# Patient Record
Sex: Male | Born: 1985 | Race: White | Hispanic: No | Marital: Single | Smoking: Never smoker
Health system: Southern US, Community
[De-identification: ages and names within clinical notes are randomized; demographics above are authoritative.]

---
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
## 2010-12-20 ENCOUNTER — Emergency Department: Payer: Self-pay | Admitting: Emergency Medicine

## 2013-12-24 ENCOUNTER — Ambulatory Visit (INDEPENDENT_AMBULATORY_CARE_PROVIDER_SITE_OTHER): Payer: BC Managed Care – PPO | Admitting: Sports Medicine

## 2013-12-24 ENCOUNTER — Encounter: Payer: Self-pay | Admitting: Sports Medicine

## 2013-12-24 VITALS — BP 128/75 | HR 58 | Ht 69.0 in | Wt 175.0 lb

## 2013-12-24 DIAGNOSIS — M7701 Medial epicondylitis, right elbow: Secondary | ICD-10-CM | POA: Insufficient documentation

## 2013-12-24 DIAGNOSIS — Z299 Encounter for prophylactic measures, unspecified: Secondary | ICD-10-CM

## 2013-12-24 DIAGNOSIS — Z0000 Encounter for general adult medical examination without abnormal findings: Secondary | ICD-10-CM | POA: Insufficient documentation

## 2013-12-24 DIAGNOSIS — M7700 Medial epicondylitis, unspecified elbow: Secondary | ICD-10-CM

## 2013-12-24 MED ORDER — NITROGLYCERIN 0.2 MG/HR TD PT24: MEDICATED_PATCH | TRANSDERMAL | Status: DC

## 2013-12-24 MED ORDER — MELOXICAM 15 MG PO TABS: ORAL_TABLET | ORAL | Status: DC

## 2013-12-24 MED ORDER — HYDROCODONE-ACETAMINOPHEN 5-325 MG PO TABS: 1.0000 | ORAL_TABLET | Freq: Three times a day (TID) | ORAL | Status: DC | PRN

## 2013-12-24 NOTE — Assessment & Plan Note (Addendum)
Persistent for several months, unfortunately has failed oral steroids, NSAIDs, formal physical therapy. Ultrasound guided percutaneous needle tenotomy as above. Continue icing, bracing, switching to Mobic, continue rehabilitation. Topical nitroglycerin patch. Avoid provocative motions in the gym. Return to see me in one month. Hydrocodone as needed for breakthrough pain.

## 2013-12-24 NOTE — Progress Notes (Signed)
Patient ID: Thomas Ho, male   DOB: 05/15/1986, 28 y.o.   MRN: [id number]  Subjective:    CC: Establish care. Right Elbow Pain  HPI: Patient is a healthy 28 yo man here to establish primary care.  Today he endorses right elbow pain of 9 months duration.  The pain started gradually as he was working out at [employer].  He is an avid weight lifter and works out 5 days a week.  The pain is always present and is located on the medial aspect of his elbow.  He has seen an orthopedic surgeon in the past for this and was diagnosed with medial epicondylitis.  He has since failed to respond to conservative treatment with prednisone, diclofenac, and PT.  No formal imaging was done.  He denies any other symptomology at this point.   Past medical history, Surgical history, Family history not pertinant except as noted below, Social history, Allergies, and medications have been entered into the medical record, reviewed, and no changes needed.   Review of Systems: No headache, visual changes, nausea, vomiting, diarrhea, constipation, dizziness, abdominal pain, skin rash, fevers, chills, night sweats, swollen lymph nodes, weight loss, chest pain, body aches, joint swelling, muscle aches, shortness of breath, mood changes, visual or auditory hallucinations.  Objective:    General: Well Developed, well nourished, and in no acute distress.  Neuro: Alert and oriented x3, extra-ocular muscles intact, sensation grossly intact.  HEENT: Normocephalic, atraumatic, pupils equal round reactive to light, neck supple, no masses, no lymphadenopathy, thyroid nonpalpable.  Skin: Warm and dry, no rashes noted.  Cardiac: Regular rate and rhythm, no murmurs rubs or gallops.  Respiratory: Clear to auscultation bilaterally. Not using accessory muscles, speaking in full sentences.  Abdominal: Soft, nontender, nondistended, positive bowel sounds, no masses, no organomegaly.  Musculoskeletal: Shoulder, wrist, hip, knee, ankle stable,  and with full range of motion. Right Elbow: Unremarkable to inspection. Range of motion full pronation, supination, flexion, extension. Strength is full to all of the above directions Stable to varus, valgus stress. Negative moving valgus stress test. Point tenderness over insertion of wrist flexor supinators  Pain with resisted supination Ulnar nerve does not sublux. Negative cubital tunnel Tinel's.  Procedure: Real-time Ultrasound Guided needle tenotomy/Injection of common flexor tendon origin on the medial epicondyle Device: GE Logiq E  Verbal informed consent obtained.  Time-out conducted.  Noted no overlying erythema, induration, or other signs of local infection.  Skin prepped in a sterile fashion.  Local anesthesia: Topical Ethyl chloride.  With sterile technique and under real time ultrasound guidance:  Approximately 50 pounds as were made with a 25-gauge needle while injecting a total of 1 cc Kenalog 40, 3 cc lidocaine. Completed without difficulty  Pain immediately resolved suggesting accurate placement of the medication.  Advised to call if fevers/chills, erythema, induration, drainage, or persistent bleeding.  Images permanently stored and available for review in the ultrasound unit.  Impression: Technically successful ultrasound guided injection.  Impression and Recommendations:    The patient was counselled, risk factors were discussed, anticipatory guidance given.  Patient with medial epicondylitis who has failed conservative treatments.  Likely has continued pain secondary to overuse injury to flexor tendons.   -Needle tenotomy w/ steroid injection -nitroglycerin patch qd -ice x 5 days minimum -no upper body workouts x 5 days -f/u 1 month

## 2014-01-21 ENCOUNTER — Ambulatory Visit: Payer: BC Managed Care – PPO | Admitting: Sports Medicine

## 2014-03-24 ENCOUNTER — Encounter: Payer: Self-pay | Admitting: Sports Medicine

## 2014-03-24 ENCOUNTER — Ambulatory Visit (INDEPENDENT_AMBULATORY_CARE_PROVIDER_SITE_OTHER): Payer: BC Managed Care – PPO | Admitting: Sports Medicine

## 2014-03-24 VITALS — BP 127/66 | HR 56 | Ht 68.0 in | Wt 178.0 lb

## 2014-03-24 DIAGNOSIS — M7700 Medial epicondylitis, unspecified elbow: Secondary | ICD-10-CM

## 2014-03-24 DIAGNOSIS — M7701 Medial epicondylitis, right elbow: Secondary | ICD-10-CM

## 2014-03-24 MED ORDER — PREDNISONE 50 MG PO TABS: ORAL_TABLET | ORAL | Status: DC

## 2014-03-24 NOTE — Assessment & Plan Note (Signed)
I do think that a medial epicondylitis has resolved after percutaneous needle tenotomy. He does have some pain in the flexor muscle belly. He has an elbow sleeve at home which he will wear, prednisone burst. He did stop nitroglycerin patches but was using an entire patch rather than a quarter patch, and did have some headaches as expected. He needs to restart one quarter patch daily.

## 2014-03-24 NOTE — Progress Notes (Signed)
  Subjective:    CC: Followup  HPI: Right medial epicondylitis: 100% pain relief after ultrasound-guided percutaneous tenotomy, he then had a recurrence of pain 2 weeks ago in his forearm muscles but not at the medial epicondyle. Pain is mild, persistent. No radiation.  Past medical history, Surgical history, Family history not pertinant except as noted below, Social history, Allergies, and medications have been entered into the medical record, reviewed, and no changes needed.   Review of Systems: No fevers, chills, night sweats, weight loss, chest pain, or shortness of breath.   Objective:    General: Well Developed, well nourished, and in no acute distress.  Neuro: Alert and oriented x3, extra-ocular muscles intact, sensation grossly intact.  HEENT: Normocephalic, atraumatic, pupils equal round reactive to light, neck supple, no masses, no lymphadenopathy, thyroid nonpalpable.  Skin: Warm and dry, no rashes. Cardiac: Regular rate and rhythm, no murmurs rubs or gallops, no lower extremity edema.  Respiratory: Clear to auscultation bilaterally. Not using accessory muscles, speaking in full sentences. Right Elbow: Unremarkable to inspection. Range of motion full pronation, supination, flexion, extension. Strength is full to all of the above directions Stable to varus, valgus stress. Negative moving valgus stress test. No discrete areas of tenderness to palpation. Specifically no tenderness to palpation at the medial epicondyle. Ulnar nerve does not sublux. Negative cubital tunnel Tinel's.  Impression and Recommendations:

## 2014-04-24 ENCOUNTER — Encounter: Payer: Self-pay | Admitting: Sports Medicine

## 2014-04-24 ENCOUNTER — Ambulatory Visit (INDEPENDENT_AMBULATORY_CARE_PROVIDER_SITE_OTHER): Payer: BC Managed Care – PPO | Admitting: Sports Medicine

## 2014-04-24 VITALS — BP 101/62 | HR 78 | Ht 68.0 in | Wt 175.0 lb

## 2014-04-24 DIAGNOSIS — M7701 Medial epicondylitis, right elbow: Secondary | ICD-10-CM

## 2014-04-24 DIAGNOSIS — M7700 Medial epicondylitis, unspecified elbow: Secondary | ICD-10-CM | POA: Diagnosis not present

## 2014-04-24 MED ORDER — DICLOFENAC SODIUM 2 % TD SOLN: 2.0000 | Freq: Two times a day (BID) | TRANSDERMAL | Status: DC

## 2014-04-24 NOTE — Assessment & Plan Note (Signed)
Unfortunately home rehabilitation and topical nitroglycerin. At this point we are going to try some more conservative measures including icing, continue topical nitroglycerin, topical diclofenac, eccentric rehabilitation, decreased flexion exercises in the gym, and wear compression sleeve. If no improvement after about 6 weeks we certainly should consider platelet rich plasma injection.

## 2014-04-24 NOTE — Patient Instructions (Signed)
Including icing, continue topical nitroglycerin, topical diclofenac, eccentric rehabilitation, decreased flexion exercises in the gym, and wear compression sleeve. If no improvement after about 6 weeks we certainly should consider platelet rich plasma injection.  For eccentric rehabilitation do 3 sets of 10 repetitions with letting a weight down slowly with your wrist.  Please looked into PRP injections

## 2014-04-24 NOTE — Progress Notes (Signed)
  Subjective:    CC: Followup  HPI: Right golfers elbow: Did initially well after an ultrasound guided percutaneous needle tonight, unfortunately is now starting to have recurrence of pain in his not desire any further interventional treatment. He works out daily, and has persistent pain just distal to the medial epicondyle over the common flexor tendon. This pain is reproduced with flexion and ulnar deviation of the wrist as well as with strong grip.  Past medical history, Surgical history, Family history not pertinant except as noted below, Social history, Allergies, and medications have been entered into the medical record, reviewed, and no changes needed.   Review of Systems: No fevers, chills, night sweats, weight loss, chest pain, or shortness of breath.   Objective:    General: Well Developed, well nourished, and in no acute distress.  Neuro: Alert and oriented x3, extra-ocular muscles intact, sensation grossly intact.  HEENT: Normocephalic, atraumatic, pupils equal round reactive to light, neck supple, no masses, no lymphadenopathy, thyroid nonpalpable.  Skin: Warm and dry, no rashes. Cardiac: Regular rate and rhythm, no murmurs rubs or gallops, no lower extremity edema.  Respiratory: Clear to auscultation bilaterally. Not using accessory muscles, speaking in full sentences. Right Elbow: Unremarkable to inspection. Range of motion full pronation, supination, flexion, extension. Strength is full to all of the above directions Stable to varus, valgus stress. Negative moving valgus stress test. Tender to palpation of the common flexor tendon origin. Ulnar nerve does not sublux. Negative cubital tunnel Tinel's.  Impression and Recommendations:

## 2014-06-04 ENCOUNTER — Ambulatory Visit (INDEPENDENT_AMBULATORY_CARE_PROVIDER_SITE_OTHER): Payer: BC Managed Care – PPO

## 2014-06-04 ENCOUNTER — Ambulatory Visit (INDEPENDENT_AMBULATORY_CARE_PROVIDER_SITE_OTHER): Payer: BC Managed Care – PPO | Admitting: Sports Medicine

## 2014-06-04 ENCOUNTER — Telehealth: Payer: Self-pay

## 2014-06-04 ENCOUNTER — Encounter: Payer: Self-pay | Admitting: Sports Medicine

## 2014-06-04 VITALS — BP 140/79 | HR 69 | Ht 69.0 in | Wt 182.0 lb

## 2014-06-04 DIAGNOSIS — M7700 Medial epicondylitis, unspecified elbow: Secondary | ICD-10-CM | POA: Diagnosis not present

## 2014-06-04 DIAGNOSIS — M7701 Medial epicondylitis, right elbow: Secondary | ICD-10-CM

## 2014-06-04 DIAGNOSIS — M25529 Pain in unspecified elbow: Secondary | ICD-10-CM

## 2014-06-04 MED ORDER — DIAZEPAM 5 MG PO TABS: ORAL_TABLET | ORAL | Status: DC

## 2014-06-04 NOTE — Progress Notes (Signed)
  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 Unfortunately he has persistent medial epicondylitis. We have done a single percutaneous needle tenotomy with steroid injection, NSAIDs, topical nitroglycerin, topical NSAIDs, and aggressive rehabilitation. He continues to have pain, nearly as bad as the first visit. We have not done anything. Symptoms are moderate, persistent.  Past medical history, Surgical history, Family history not pertinant except as noted below, Social history, Allergies, and medications have been entered into the medical record, reviewed, and no changes needed.   Review of Systems: No fevers, chills, night sweats, weight loss, chest pain, or shortness of breath.   Objective:    General: Well Developed, well nourished, and in no acute distress.  Neuro: Alert and oriented x3, extra-ocular muscles intact, sensation grossly intact.  HEENT: Normocephalic, atraumatic, pupils equal round reactive to light, neck supple, no masses, no lymphadenopathy, thyroid nonpalpable.  Skin: Warm and dry, no rashes. Cardiac: Regular rate and rhythm, no murmurs rubs or gallops, no lower extremity edema.  Respiratory: Clear to auscultation bilaterally. Not using accessory muscles, speaking in full sentences. Right elbow: Persistent tenderness to palpation at the medial epicondyle.  X-rays are unremarkable as expected.  Impression and Recommendations:

## 2014-06-04 NOTE — Telephone Encounter (Signed)
Prior Auth for MRI right elbow without contrast [id number] from now until 07/03/2014

## 2014-06-04 NOTE — Assessment & Plan Note (Signed)
Persistent symptoms despite now approximately 3 months of conservative measures. This is also persistent despite percutaneous needle tenotomy with steroid injection. X-ray, MRI, we are going to set him up for platelet rich plasma injections.

## 2014-06-09 ENCOUNTER — Ambulatory Visit (INDEPENDENT_AMBULATORY_CARE_PROVIDER_SITE_OTHER): Payer: BC Managed Care – PPO

## 2014-06-09 DIAGNOSIS — M7701 Medial epicondylitis, right elbow: Secondary | ICD-10-CM

## 2014-06-13 ENCOUNTER — Ambulatory Visit (INDEPENDENT_AMBULATORY_CARE_PROVIDER_SITE_OTHER): Payer: BC Managed Care – PPO | Admitting: Sports Medicine

## 2014-06-13 ENCOUNTER — Encounter: Payer: Self-pay | Admitting: Sports Medicine

## 2014-06-13 VITALS — BP 133/81 | HR 97 | Ht 69.0 in | Wt 183.0 lb

## 2014-06-13 DIAGNOSIS — M7701 Medial epicondylitis, right elbow: Secondary | ICD-10-CM | POA: Diagnosis not present

## 2014-06-13 MED ORDER — HYDROCODONE-ACETAMINOPHEN 5-325 MG PO TABS: 1.0000 | ORAL_TABLET | Freq: Three times a day (TID) | ORAL | Status: DC | PRN

## 2014-06-13 NOTE — Progress Notes (Addendum)
  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 At this point he desires to try platelet rich plasma.  Past medical history, Surgical history, Family history not pertinant except as noted below, Social history, Allergies, and medications have been entered into the medical record, reviewed, and no changes needed.   Review of Systems: No fevers, chills, night sweats, weight loss, chest pain, or shortness of breath.   Objective:    General: Well Developed, well nourished, and in no acute distress.  Neuro: Alert and oriented x3, extra-ocular muscles intact, sensation grossly intact.  HEENT: Normocephalic, atraumatic, pupils equal round reactive to light, neck supple, no masses, no lymphadenopathy, thyroid nonpalpable.  Skin: Warm and dry, no rashes. Cardiac: Regular rate and rhythm, no murmurs rubs or gallops, no lower extremity edema.  Respiratory: Clear to auscultation bilaterally. Not using accessory muscles, speaking in full sentences.  Procedure: Real-time Ultrasound Guided Platelet Rich Plasma (PRP) Injection of right common flexor tendon origin Device: GE Logiq E  Verbal informed consent obtained.  Time-out conducted.  Noted no overlying erythema, induration, or other signs of local infection.  Obtained 30 cc of blood from peripheral vein, using the "PEAK" centrifuge, red blood cells were separated from the plasma. Subsequently red blood cells were drained leaving only plasma with the buffy coat layer between the desired lines. Platelet poor plasma was then centrifuged out, and remaining platelet rich plasma aspirated into a 5 cc syringe.  Skin prepped in a sterile fashion.  Local anesthesia: Topical  Ethyl chloride.  With sterile technique and under real time ultrasound guidance the platelet rich plasma (PRP) obtained above:  3-4 cc of platelet rich plasma was injected into and around the common flexor tendon origin. Completed without difficulty  Advised to call if fevers/chills, erythema, induration, drainage, or persistent bleeding.  Images permanently stored and available for review in the ultrasound unit.  Impression: Technically successful ultrasound guided platelet rich plasma (PRP) injection.  I spent 40 minutes with this patient, greater than 50% was face-to-face time counseling and procedural time regarding the below diagnosis.  Impression and Recommendations:

## 2014-06-13 NOTE — Assessment & Plan Note (Signed)
After failure of 3 months of conservative measures including 2 steroid injections, multiple rounds of formal physical therapy as well as topical nitroglycerin we did a platelet rich plasma injection/percutaneous needle tenotomy today. Return in one month. Vicodin for pain. No NSAIDs, avoid working out. Strapped elbow with compressive dressing.

## 2014-07-11 ENCOUNTER — Encounter: Payer: Self-pay | Admitting: Sports Medicine

## 2014-07-11 ENCOUNTER — Ambulatory Visit (INDEPENDENT_AMBULATORY_CARE_PROVIDER_SITE_OTHER): Payer: BC Managed Care – PPO | Admitting: Sports Medicine

## 2014-07-11 DIAGNOSIS — M7701 Medial epicondylitis, right elbow: Secondary | ICD-10-CM | POA: Diagnosis not present

## 2014-07-11 NOTE — Assessment & Plan Note (Signed)
Medial epicondylitis confirmed on MRI, ulnar collateral ligament has been stable on exam and with imaging. Unfortunately he has failed formal physical therapy, topical nitroglycerin, two steroid percutaneous needle tenotomies. More recently he failed a platelet rich plasma injection. At this point he has become a good surgical candidate. Referral to Dr. Ave Filterhandler.

## 2014-07-11 NOTE — Progress Notes (Signed)
  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 He has been through formal physical therapy, two steroid injections, percutaneous needle tenotomy, topical nitroglycerin and more recently a PRP/platelet rich plasma injection, unfortunately continues to have pain.pain is localized at the origin of the common flexor tendon, moderate, persistent.  Past medical history, Surgical history, Family history not pertinant except as noted below, Social history, Allergies, and medications have been entered into the medical record, reviewed, and no changes needed.   Review of Systems: No fevers, chills, night sweats, weight loss, chest pain, or shortness of breath.   Objective:    General: Well Developed, well nourished, and in no acute distress.  Neuro: Alert and oriented x3, extra-ocular muscles intact, sensation grossly intact.  HEENT: Normocephalic, atraumatic, pupils equal round reactive to light, neck supple, no masses, no lymphadenopathy, thyroid nonpalpable.  Skin: Warm and dry, no rashes. Cardiac: Regular rate and rhythm, no murmurs rubs or gallops, no lower extremity edema.  Respiratory: Clear to auscultation bilaterally. Not using accessory muscles, speaking in full sentences. Right Elbow: Unremarkable to inspection. Range of motion full pronation, supination, flexion, extension. Strength is full to all of the above directions Stable to varus, valgus stress. Negative moving valgus stress test. Tender to palpation at the origin of the common flexor tendon. Ulnar nerve does not sublux. Negative cubital tunnel Tinel's.  Impression and Recommendations:

## 2014-12-23 ENCOUNTER — Ambulatory Visit: Payer: Self-pay | Admitting: Family Medicine

## 2014-12-30 ENCOUNTER — Ambulatory Visit: Payer: Self-pay | Admitting: Sports Medicine

## 2015-01-06 ENCOUNTER — Ambulatory Visit (INDEPENDENT_AMBULATORY_CARE_PROVIDER_SITE_OTHER): Payer: BLUE CROSS/BLUE SHIELD | Admitting: Sports Medicine

## 2015-01-06 ENCOUNTER — Encounter: Payer: Self-pay | Admitting: Sports Medicine

## 2015-01-06 VITALS — BP 127/76 | HR 95 | Ht 68.0 in | Wt 176.0 lb

## 2015-01-06 DIAGNOSIS — Z0000 Encounter for general adult medical examination without abnormal findings: Secondary | ICD-10-CM | POA: Diagnosis not present

## 2015-01-06 DIAGNOSIS — R590 Localized enlarged lymph nodes: Secondary | ICD-10-CM

## 2015-01-06 DIAGNOSIS — M7701 Medial epicondylitis, right elbow: Secondary | ICD-10-CM

## 2015-01-06 DIAGNOSIS — E230 Hypopituitarism: Secondary | ICD-10-CM | POA: Insufficient documentation

## 2015-01-06 DIAGNOSIS — R6882 Decreased libido: Secondary | ICD-10-CM

## 2015-01-06 MED ORDER — AZITHROMYCIN 250 MG PO TABS: ORAL_TABLET | ORAL | Status: DC

## 2015-01-06 NOTE — Progress Notes (Signed)
  Subjective:    CC: CPE   HPI:  This pleasant 29 year old male returns for complete physical.  Medial epicondylitis: Has failed multiple forms of treatment including physical therapy, bracing, nitroglycerin patches, steroid injections, platelet rich plasma injections, and surgery. He has not been able to get back to his previous level of functioning in the gym.  Low libido: Not being able to workout has seemed to have interfered with his libido, he does feel slightly depressed.  Lump in chest: Right-sided, lower axilla, minimally tender, improving. Present now for 2 weeks.  Past medical history, Surgical history, Family history not pertinant except as noted below, Social history, Allergies, and medications have been entered into the medical record, reviewed, and no changes needed.   Review of Systems: No headache, visual changes, nausea, vomiting, diarrhea, constipation, dizziness, abdominal pain, skin rash, fevers, chills, night sweats, swollen lymph nodes, weight loss, chest pain, body aches, joint swelling, muscle aches, shortness of breath, mood changes, visual or auditory hallucinations.  Objective:    General: Well Developed, well nourished, and in no acute distress.  Neuro: Alert and oriented x3, extra-ocular muscles intact, sensation grossly intact. Cranial nerves II through XII are intact, motor, sensory, and coordinative functions are all intact. HEENT: Normocephalic, atraumatic, pupils equal round reactive to light, neck supple, no masses, no lymphadenopathy, thyroid nonpalpable. Oropharynx, nasopharynx, external ear canals are unremarkable. Skin: Warm and dry, no rashes noted.  Cardiac: Regular rate and rhythm, no murmurs rubs or gallops.  Respiratory: Clear to auscultation bilaterally. Not using accessory muscles, speaking in full sentences. There is a 1 cm, well-defined, movable lymph node palpable in the right lower axilla, nontender. Abdominal: Soft, nontender, nondistended,  positive bowel sounds, no masses, no organomegaly.  Musculoskeletal: Shoulder, elbow, wrist, hip, knee, ankle stable, and with full range of motion.still with tenderness at the right medial epicondyle.  Impression and Recommendations:    The patient was counselled, risk factors were discussed, anticipatory guidance given.

## 2015-01-06 NOTE — Assessment & Plan Note (Signed)
Right-sided lower axillary, improving. Present for 2 weeks. Azithromycin, return in one month, ultrasound and biopsy if no better.

## 2015-01-06 NOTE — Assessment & Plan Note (Signed)
Complete physical exam as above. Checking routine blood work.

## 2015-01-06 NOTE — Assessment & Plan Note (Signed)
Persistent symptoms despite injections, physical therapy, bracing, nitroglycerin, platelet rich plasma injections, and surgery. He is going to stop all working out for now. I will discuss this with my colleagues to see if there are any other options.

## 2015-01-06 NOTE — Assessment & Plan Note (Signed)
Checking testosterone levels. There may be an element of depression considering his inability to work out now.

## 2015-01-07 LAB — HEMOGLOBIN A1C
Hgb A1c MFr Bld: 5.9 % — ABNORMAL HIGH (ref <5.7)
Mean Plasma Glucose: 123 mg/dL — ABNORMAL HIGH (ref <117)

## 2015-01-07 LAB — COMPREHENSIVE METABOLIC PANEL
ALT: 14 U/L (ref 0–53)
AST: 22 U/L (ref 0–37)
Alkaline Phosphatase: 68 U/L (ref 39–117)
Creat: 1.1 mg/dL (ref 0.50–1.35)
Glucose, Bld: 98 mg/dL (ref 70–99)
Potassium: 4.2 mEq/L (ref 3.5–5.3)
Sodium: 138 mEq/L (ref 135–145)
Total Bilirubin: 0.6 mg/dL (ref 0.2–1.2)

## 2015-01-07 LAB — CBC
HCT: 41.2 % (ref 39.0–52.0)
Hemoglobin: 14.5 g/dL (ref 13.0–17.0)
MCH: 28.9 pg (ref 26.0–34.0)
MCHC: 35.2 g/dL (ref 30.0–36.0)
MCV: 82.2 fL (ref 78.0–100.0)
MPV: 9.6 fL (ref 8.6–12.4)
Platelets: 250 10*3/uL (ref 150–400)
RBC: 5.01 MIL/uL (ref 4.22–5.81)
RDW: 13.5 % (ref 11.5–15.5)
WBC: 4.2 10*3/uL (ref 4.0–10.5)

## 2015-01-07 LAB — COMPREHENSIVE METABOLIC PANEL WITH GFR
Albumin: 4.4 g/dL (ref 3.5–5.2)
BUN: 17 mg/dL (ref 6–23)
CO2: 28 meq/L (ref 19–32)
Calcium: 9.4 mg/dL (ref 8.4–10.5)
Chloride: 103 meq/L (ref 96–112)
Total Protein: 7.1 g/dL (ref 6.0–8.3)

## 2015-01-07 LAB — LIPID PANEL
Cholesterol: 153 mg/dL (ref 0–200)
HDL: 64 mg/dL (ref >=40)
LDL Cholesterol: 77 mg/dL (ref 0–99)
Total CHOL/HDL Ratio: 2.4 ratio
Triglycerides: 59 mg/dL (ref <150)
VLDL: 12 mg/dL (ref 0–40)

## 2015-01-07 LAB — TSH: TSH: 1.204 u[IU]/mL (ref 0.350–4.500)

## 2015-01-08 ENCOUNTER — Encounter: Payer: Self-pay | Admitting: Sports Medicine

## 2015-01-08 LAB — TESTOSTERONE, FREE, TOTAL, SHBG
Sex Hormone Binding: 25 nmol/L (ref 10–50)
Testosterone, Free: 80.4 pg/mL (ref 47.0–244.0)
Testosterone-% Free: 2.3 % (ref 1.6–2.9)
Testosterone: 346 ng/dL (ref 300–890)

## 2015-01-08 LAB — VITAMIN D 25 HYDROXY (VIT D DEFICIENCY, FRACTURES): Vit D, 25-Hydroxy: 39 ng/mL (ref 30–100)

## 2015-02-03 ENCOUNTER — Ambulatory Visit: Payer: BLUE CROSS/BLUE SHIELD | Admitting: Sports Medicine

## 2015-02-06 ENCOUNTER — Ambulatory Visit (INDEPENDENT_AMBULATORY_CARE_PROVIDER_SITE_OTHER): Payer: BLUE CROSS/BLUE SHIELD | Admitting: Sports Medicine

## 2015-02-06 ENCOUNTER — Encounter: Payer: Self-pay | Admitting: Sports Medicine

## 2015-02-06 VITALS — BP 143/84 | HR 80 | Ht 69.0 in | Wt 176.0 lb

## 2015-02-06 DIAGNOSIS — M7701 Medial epicondylitis, right elbow: Secondary | ICD-10-CM

## 2015-02-06 DIAGNOSIS — R6882 Decreased libido: Secondary | ICD-10-CM

## 2015-02-06 DIAGNOSIS — R590 Localized enlarged lymph nodes: Secondary | ICD-10-CM | POA: Diagnosis not present

## 2015-02-06 DIAGNOSIS — E230 Hypopituitarism: Secondary | ICD-10-CM

## 2015-02-06 DIAGNOSIS — E291 Testicular hypofunction: Secondary | ICD-10-CM

## 2015-02-06 NOTE — Assessment & Plan Note (Signed)
Unfortunately has failed all conservative measures, rehabilitation and surgery. He feels as though he has read toward the medial epicondyle. I am going to order a new MRI, we will discuss treatment afterwards.

## 2015-02-06 NOTE — Assessment & Plan Note (Addendum)
Testosterone levels were in the low limit of normal. We are going to check a morning FSH, LH, and repeat testosterone. He would be a Clomid candidate.  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  Adding clomiphene, one half tab daily, recheck morning testosterone levels in a month.

## 2015-02-06 NOTE — Assessment & Plan Note (Signed)
Persistent, checking ultrasound. He did fail antibiotics, blood work has been normal.

## 2015-02-06 NOTE — Progress Notes (Signed)
  Subjective:    CC: Follow-up  HPI: Low libido: Testosterone levels were in the low limit of normal, amenable to recheck.  Medial epicondylitis: Has failed all conservative measures, he also failed surgical correction. He does feel like he has reached hornet, and is amenable to do a new MRI. Pain is moderate, persistent.  Right axillary adenopathy: Persistent despite anti-biotics, nontender, CBC was normal.  Past medical history, Surgical history, Family history not pertinant except as noted below, Social history, Allergies, and medications have been entered into the medical record, reviewed, and no changes needed.   Review of Systems: No fevers, chills, night sweats, weight loss, chest pain, or shortness of breath.   Objective:    General: Well Developed, well nourished, and in no acute distress.  Neuro: Alert and oriented x3, extra-ocular muscles intact, sensation grossly intact.  HEENT: Normocephalic, atraumatic, pupils equal round reactive to light, neck supple, no masses, no lymphadenopathy, thyroid nonpalpable.  Skin: Warm and dry, no rashes. Cardiac: Regular rate and rhythm, no murmurs rubs or gallops, no lower extremity edema.  Respiratory: Clear to auscultation bilaterally. Not using accessory muscles, speaking in full sentences. I am able to palpate a's 1 cm, well-defined, movable, nontender mass in the right axilla.  Impression and Recommendations:    I spent 40 minutes with this patient, greater than 50% was face-to-face time counseling regarding the above diagnoses.

## 2015-02-09 ENCOUNTER — Ambulatory Visit (INDEPENDENT_AMBULATORY_CARE_PROVIDER_SITE_OTHER): Payer: BLUE CROSS/BLUE SHIELD

## 2015-02-09 DIAGNOSIS — R590 Localized enlarged lymph nodes: Secondary | ICD-10-CM

## 2015-02-10 LAB — TESTOSTERONE, FREE, TOTAL, SHBG
Sex Hormone Binding: 21 nmol/L (ref 10–50)
Testosterone, Free: 78.8 pg/mL (ref 47.0–244.0)
Testosterone-% Free: 2.5 % (ref 1.6–2.9)
Testosterone: 315 ng/dL (ref 300–890)

## 2015-02-10 LAB — FOLLICLE STIMULATING HORMONE: FSH: 1.4 m[IU]/mL (ref 1.4–18.1)

## 2015-02-10 LAB — LUTEINIZING HORMONE: LH: 2.7 m[IU]/mL (ref 1.5–9.3)

## 2015-02-16 MED ORDER — CLOMIPHENE CITRATE 50 MG PO TABS: 25.0000 mg | ORAL_TABLET | Freq: Every day | ORAL | Status: DC

## 2015-02-16 NOTE — Addendum Note (Signed)
Addended by: Monica Becton on: 02/16/2015 10:45 AM   Modules accepted: Orders

## 2015-03-04 ENCOUNTER — Ambulatory Visit: Payer: BLUE CROSS/BLUE SHIELD | Admitting: Sports Medicine

## 2016-02-26 ENCOUNTER — Ambulatory Visit (INDEPENDENT_AMBULATORY_CARE_PROVIDER_SITE_OTHER): Payer: BLUE CROSS/BLUE SHIELD | Admitting: Physician Assistant

## 2016-02-26 ENCOUNTER — Encounter: Payer: Self-pay | Admitting: Physician Assistant

## 2016-02-26 VITALS — BP 114/62 | HR 79 | Ht 69.0 in | Wt 177.0 lb

## 2016-02-26 DIAGNOSIS — H10023 Other mucopurulent conjunctivitis, bilateral: Secondary | ICD-10-CM

## 2016-02-26 DIAGNOSIS — J4521 Mild intermittent asthma with (acute) exacerbation: Secondary | ICD-10-CM | POA: Diagnosis not present

## 2016-02-26 MED ORDER — IPRATROPIUM-ALBUTEROL 0.5-2.5 (3) MG/3ML IN SOLN
3.0000 mL | Freq: Once | RESPIRATORY_TRACT | Status: AC
  Administered 2016-02-26: 3 mL via RESPIRATORY_TRACT

## 2016-02-26 MED ORDER — PREDNISONE 50 MG PO TABS: ORAL_TABLET | ORAL | Status: AC

## 2016-02-26 MED ORDER — AZITHROMYCIN 250 MG PO TABS: ORAL_TABLET | ORAL | Status: AC

## 2016-02-26 MED ORDER — POLYMYXIN B-TRIMETHOPRIM 10000-0.1 UNIT/ML-% OP SOLN: 1.0000 [drp] | OPHTHALMIC | Status: AC

## 2016-02-26 NOTE — Patient Instructions (Signed)

## 2016-02-26 NOTE — Progress Notes (Signed)
   Subjective:    Patient ID: Thomas Ho, male    DOB: 05-Dec-1985, 30 y.o.   MRN: [id number]  HPI Patient is a 30 year old male who presents to the clinic with 7 days of productive cough, nasal congestion, chest tightness. Last night his eyes started to get red. This morning he woke up with his eyes matted shut. His eyes are painful. He denies any fever, chills. He has had some shortness of breath and wheezing. At times his cough is uncontrollable. He does have a childhood history of asthma. His tried Afrin and Mucinex over-the-counter. This has not helped.   Review of Systems  All other systems reviewed and are negative.      Objective:   Physical Exam  Constitutional: He is oriented to person, place, and time. He appears well-developed and well-nourished.  HENT:  Head: Normocephalic and atraumatic.  Right Ear: External ear normal.  Left Ear: External ear normal.  TM's clear.  Oropharynx erythematous with PND.  Negative for sinus tenderness to palpation.  Bilateral nasal turbinates red and swollen.   Eyes:  Bilateral medial conjunctiva injected. No active discharge in office.   Neck: Normal range of motion. Neck supple.  Cardiovascular: Normal rate, regular rhythm and normal heart sounds.   Pulmonary/Chest:  Bilateral wheezing and rhonchi.   Lymphadenopathy:    He has no cervical adenopathy.  Neurological: He is alert and oriented to person, place, and time.  Psychiatric: He has a normal mood and affect. His behavior is normal.          Assessment & Plan:  Asthmatic bronchitis/bilateral acute conjunctivitis- DuoNeb given in office. Azithromycin and prednisone sent to the pharmacy for asthmatic bronchitis. Polytrim for bilateral as to the pharmacy. Handout given for symptomatic care.Follow-up if not improving.

## 2016-03-04 ENCOUNTER — Telehealth: Payer: Self-pay

## 2016-03-04 MED ORDER — ALBUTEROL SULFATE HFA 108 (90 BASE) MCG/ACT IN AERS: 2.0000 | INHALATION_SPRAY | Freq: Four times a day (QID) | RESPIRATORY_TRACT | Status: AC | PRN

## 2016-03-04 NOTE — Telephone Encounter (Signed)
Please send over albuterol inhaler to use 2 puffs every 4-6 hours and start mucinex 600mg  bid. Call if not improving on Monday.

## 2016-04-12 DIAGNOSIS — M9902 Segmental and somatic dysfunction of thoracic region: Secondary | ICD-10-CM | POA: Diagnosis not present

## 2016-04-12 DIAGNOSIS — M9904 Segmental and somatic dysfunction of sacral region: Secondary | ICD-10-CM | POA: Diagnosis not present

## 2016-04-12 DIAGNOSIS — M9903 Segmental and somatic dysfunction of lumbar region: Secondary | ICD-10-CM | POA: Diagnosis not present

## 2016-04-12 DIAGNOSIS — M5386 Other specified dorsopathies, lumbar region: Secondary | ICD-10-CM | POA: Diagnosis not present

## 2016-04-14 DIAGNOSIS — M5386 Other specified dorsopathies, lumbar region: Secondary | ICD-10-CM | POA: Diagnosis not present

## 2016-04-14 DIAGNOSIS — M9903 Segmental and somatic dysfunction of lumbar region: Secondary | ICD-10-CM | POA: Diagnosis not present

## 2016-04-14 DIAGNOSIS — M9904 Segmental and somatic dysfunction of sacral region: Secondary | ICD-10-CM | POA: Diagnosis not present

## 2016-04-14 DIAGNOSIS — M9902 Segmental and somatic dysfunction of thoracic region: Secondary | ICD-10-CM | POA: Diagnosis not present

## 2016-04-19 DIAGNOSIS — M9902 Segmental and somatic dysfunction of thoracic region: Secondary | ICD-10-CM | POA: Diagnosis not present

## 2016-04-19 DIAGNOSIS — M9904 Segmental and somatic dysfunction of sacral region: Secondary | ICD-10-CM | POA: Diagnosis not present

## 2016-04-19 DIAGNOSIS — M5386 Other specified dorsopathies, lumbar region: Secondary | ICD-10-CM | POA: Diagnosis not present

## 2016-04-19 DIAGNOSIS — M9903 Segmental and somatic dysfunction of lumbar region: Secondary | ICD-10-CM | POA: Diagnosis not present

## 2016-10-07 ENCOUNTER — Telehealth: Payer: Self-pay

## 2016-10-07 MED ORDER — OSELTAMIVIR PHOSPHATE 75 MG PO CAPS: 75.0000 mg | ORAL_CAPSULE | Freq: Two times a day (BID) | ORAL | 0 refills | Status: AC

## 2016-10-07 NOTE — Telephone Encounter (Signed)
Pt left VM stating he has a sore throat and dizziness/lightheadedness. Feels these are flu like symptoms and would like to know if he can get tamiflu. Please advise.

## 2016-10-07 NOTE — Telephone Encounter (Signed)
Tamiflu sent in

## 2016-10-13 IMAGING — US US MISC SOFT TISSUE
1 series · 14 of 16 positions shown · non-contrast
Comparison: None.

CLINICAL DATA: Left node, getting smaller.

EXAM:
SOFT TISSUE ULTRASOUND - MISCELLANEOUS
TECHNIQUE: Ultrasound the right axilla in the region of clinical concern
perform.

[Series 1: us misc soft tissue · 0.04mm/px · 14 of 22 slices shown]
[im 1/22]
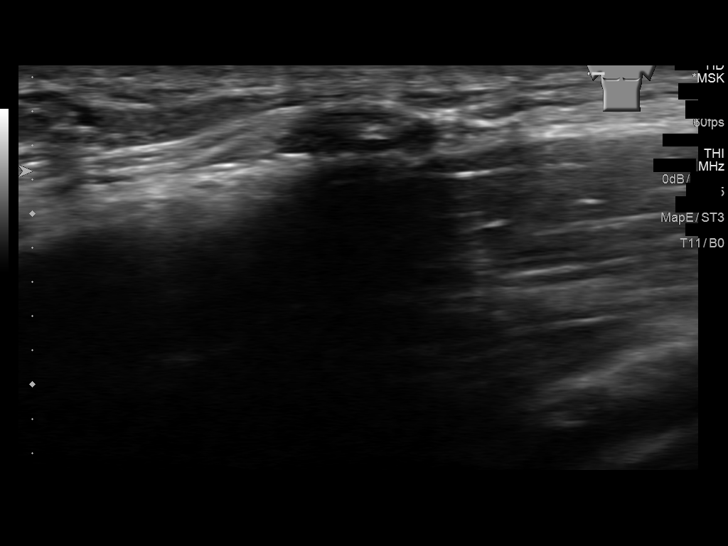
[im 2/22]
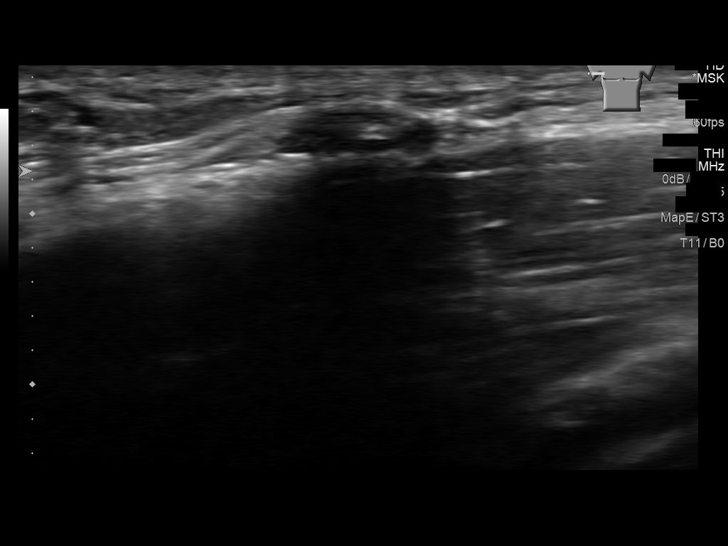
[im 3/22]
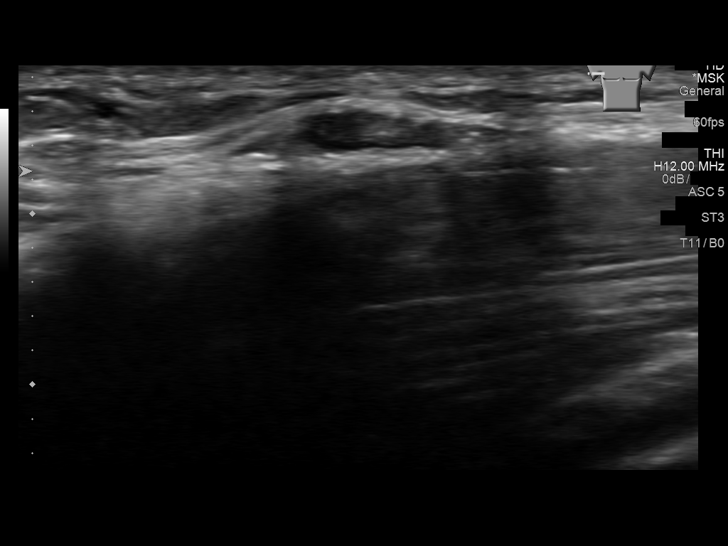
[im 6/22]
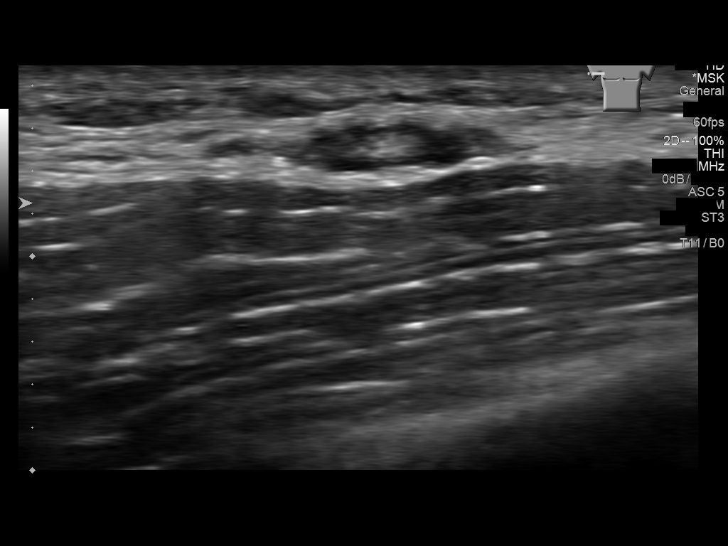
[im 8/22]
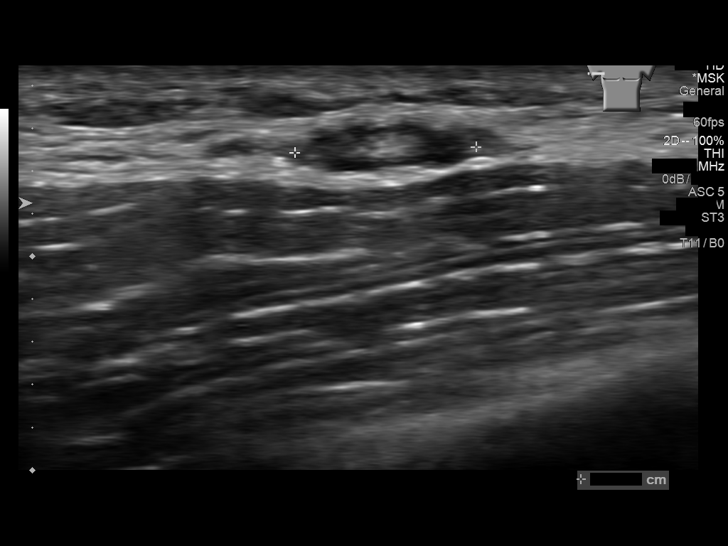
[im 9/22]
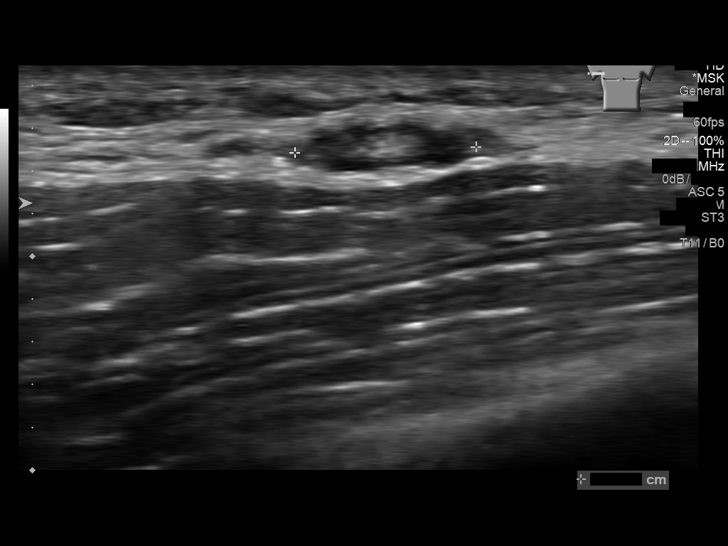
[im 10/22]
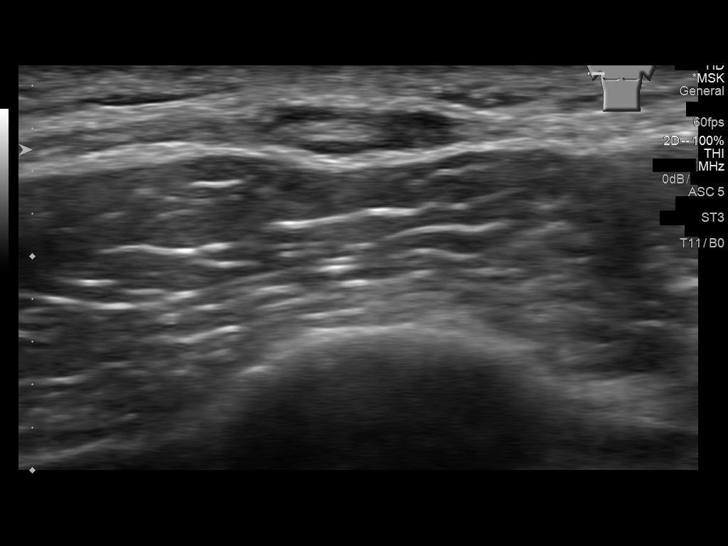
[im 12/22]
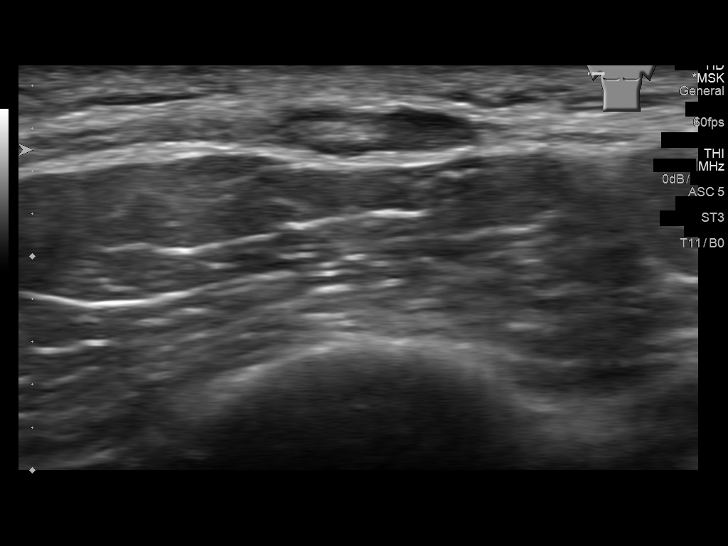
[im 13/22]
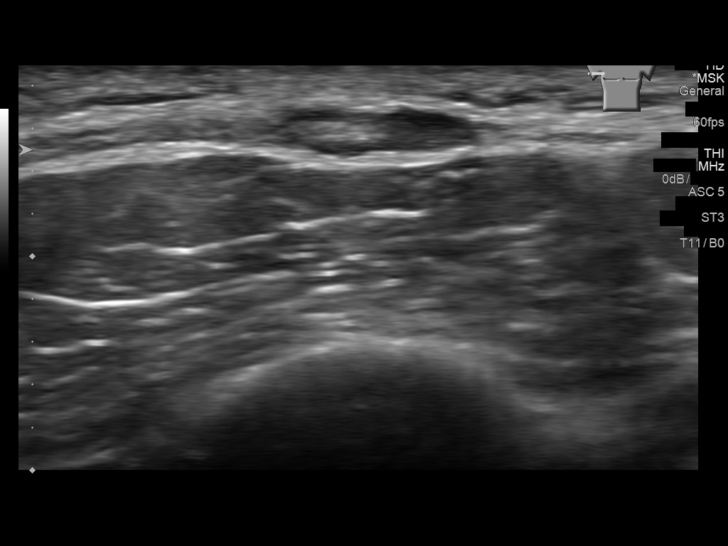
[im 15/22]
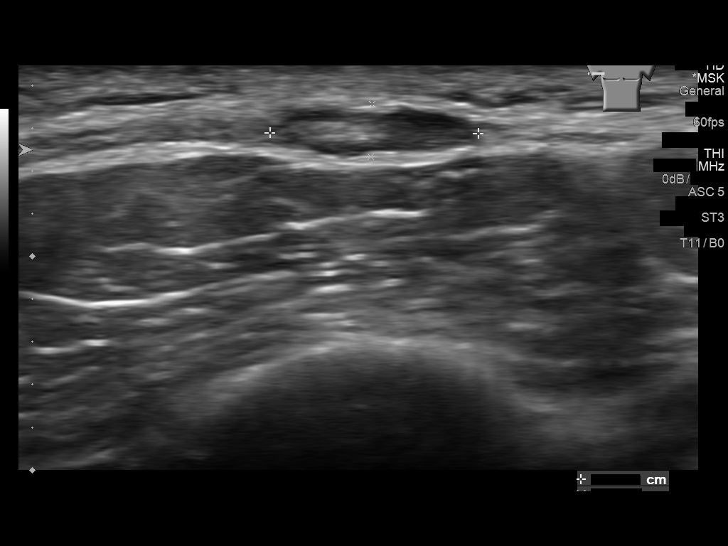
[im 17/22]
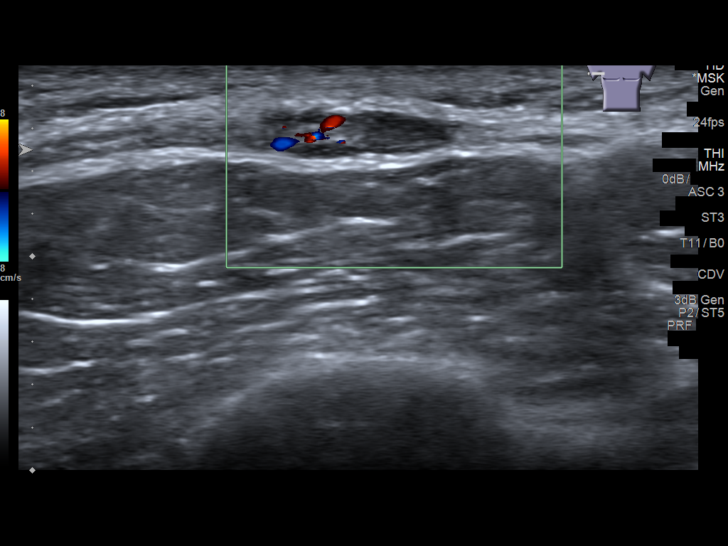
[im 19/22]
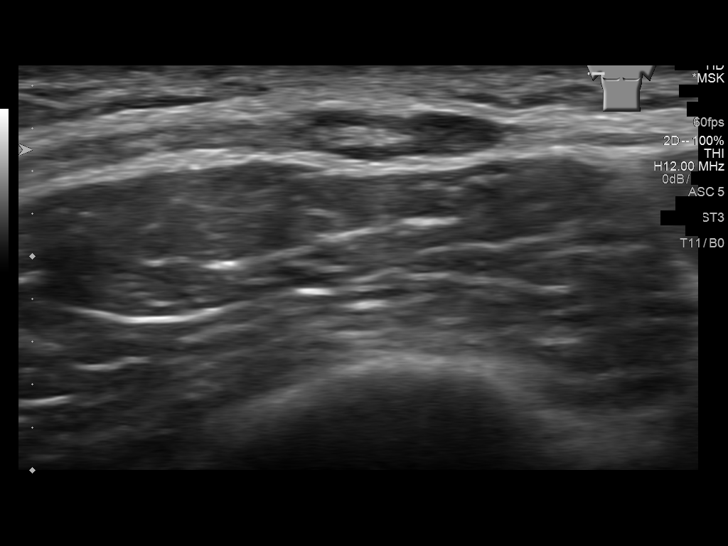
[im 20/22]
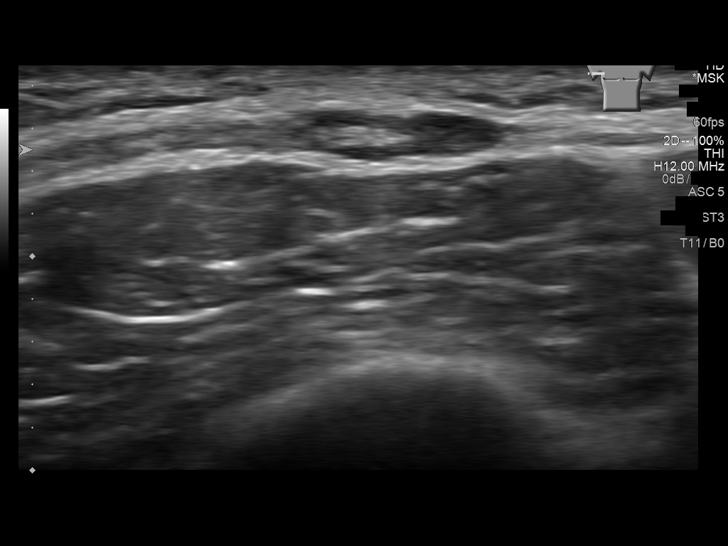
[im 22/22]
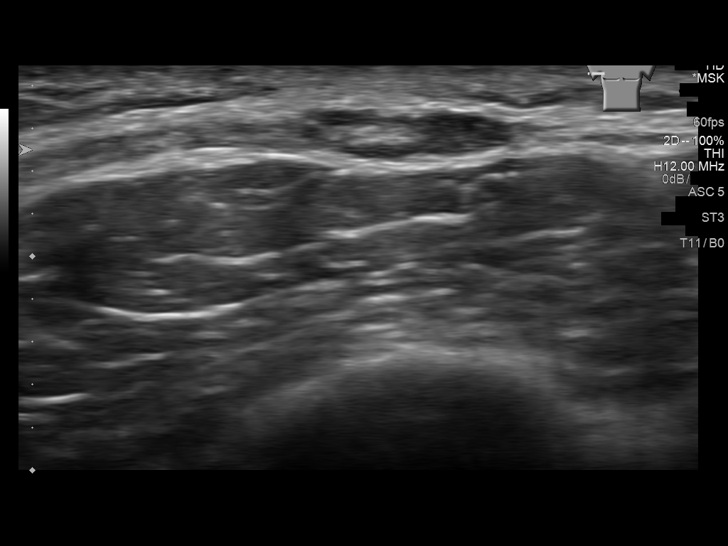

[14 of 16 positions shown; findings below may reference images not displayed]

FINDINGS: A 1.0 x 0.3 x 0.9 cm subcutaneous well-circumscribed small nodular
density with a a prominent fatty hilum noted. This is consistent
with a small benign lymph node.
IMPRESSION: Findings consistent with small benign 1.0 cm lymph node.

## 2017-05-25 DIAGNOSIS — Z119 Encounter for screening for infectious and parasitic diseases, unspecified: Secondary | ICD-10-CM | POA: Diagnosis not present

## 2017-05-30 ENCOUNTER — Ambulatory Visit (INDEPENDENT_AMBULATORY_CARE_PROVIDER_SITE_OTHER): Payer: BLUE CROSS/BLUE SHIELD | Admitting: Sports Medicine

## 2017-05-30 VITALS — BP 138/81 | HR 78 | Resp 16 | Ht 69.0 in | Wt 183.0 lb

## 2017-05-30 DIAGNOSIS — Z0000 Encounter for general adult medical examination without abnormal findings: Secondary | ICD-10-CM

## 2017-05-30 DIAGNOSIS — R0602 Shortness of breath: Secondary | ICD-10-CM

## 2017-05-30 DIAGNOSIS — L74519 Primary focal hyperhidrosis, unspecified: Secondary | ICD-10-CM | POA: Diagnosis not present

## 2017-05-30 DIAGNOSIS — E230 Hypopituitarism: Secondary | ICD-10-CM

## 2017-05-30 DIAGNOSIS — R61 Generalized hyperhidrosis: Secondary | ICD-10-CM | POA: Insufficient documentation

## 2017-05-30 MED ORDER — ALUMINUM CHLORIDE 20 % EX SOLN: CUTANEOUS | 3 refills | Status: AC

## 2017-05-30 NOTE — Assessment & Plan Note (Signed)
Currently going through the in vitro process with his wife.

## 2017-05-30 NOTE — Progress Notes (Signed)
  Subjective:    CC: annual physical  HPI:  Thomas Ho returns, overall he is doing well, he is here for his physical. He and his wife are going through the in vitro process.  In anticipation of being a father, he has been doing a great deal of binge drinking, partying. Overall he stopped but he started to have some presyncopal, and episodes of chest pain, mild shortness of breath. He has been on several trips, denies any leg swelling, no history of thromboembolism.  He also complains of severe hyperhidrosis of the axillae.  Past medical history:  Negative.  See flowsheet/record as well for more information.  Surgical history: Negative.  See flowsheet/record as well for more information.  Family history: Negative.  See flowsheet/record as well for more information.  Social history: Negative.  See flowsheet/record as well for more information.  Allergies, and medications have been entered into the medical record, reviewed, and no changes needed.    Review of Systems: No headache, visual changes, nausea, vomiting, diarrhea, constipation, dizziness, abdominal pain, skin rash, fevers, chills, night sweats, swollen lymph nodes, weight loss, chest pain, body aches, joint swelling, muscle aches, shortness of breath, mood changes, visual or auditory hallucinations.  Objective:    General: Well Developed, well nourished, and in no acute distress.  Neuro: Alert and oriented x3, extra-ocular muscles intact, sensation grossly intact. Cranial nerves II through XII are intact, motor, sensory, and coordinative functions are all intact. HEENT: Normocephalic, atraumatic, pupils equal round reactive to light, neck supple, no masses, no lymphadenopathy, thyroid nonpalpable. Oropharynx, nasopharynx, external ear canals are unremarkable. Skin: Warm and dry, no rashes noted.  Cardiac: Regular rate and rhythm, no murmurs rubs or gallops.  Respiratory: Clear to auscultation bilaterally. Not using accessory muscles,  speaking in full sentences.  Abdominal: Soft, nontender, nondistended, positive bowel sounds, no masses, no organomegaly.  Musculoskeletal: Shoulder, elbow, wrist, hip, knee, ankle stable, and with full range of motion.  Both axillae are soaked in sweat.  Impression and Recommendations:    The patient was counselled, risk factors were discussed, anticipatory guidance given.  Annual physical exam Annual physical as above, flu shot, tetanus shot. Routine blood work ordered. Did have some recent excessive alcohol consumption but he is going to stop.  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   Shortness of breath Mild but with some presyncope. With his recent trips, as well as alcohol consumption I'm going to check a BNP and a d-dimer.   Hyperhidrosis Mostly axillary, adding Drysol.  ___________________________________________ Ihor Austin. Benjamin Stain, M.D., ABFM., CAQSM. Primary Care and Sports Medicine [facility]  Adjunct Instructor of Family Medicine  [employer]

## 2017-05-30 NOTE — Assessment & Plan Note (Signed)
Mild but with some presyncope. With his recent trips, as well as alcohol consumption I'm going to check a BNP and a d-dimer.

## 2017-05-30 NOTE — Assessment & Plan Note (Signed)
Mostly axillary, adding Drysol.

## 2017-05-30 NOTE — Assessment & Plan Note (Addendum)
Annual physical as above, flu shot, tetanus shot. Routine blood work ordered. Did have some recent excessive alcohol consumption but he is going to stop.

## 2017-05-31 DIAGNOSIS — R0602 Shortness of breath: Secondary | ICD-10-CM | POA: Diagnosis not present

## 2017-05-31 DIAGNOSIS — Z0000 Encounter for general adult medical examination without abnormal findings: Secondary | ICD-10-CM | POA: Diagnosis not present

## 2017-05-31 DIAGNOSIS — E230 Hypopituitarism: Secondary | ICD-10-CM | POA: Diagnosis not present

## 2017-06-03 LAB — HEMOGLOBIN A1C
Hgb A1c MFr Bld: 5.5 % of total Hgb (ref <5.7)
Mean Plasma Glucose: 111 (calc)
eAG (mmol/L): 6.2 (calc)

## 2017-06-03 LAB — TESTOSTERONE, FREE & TOTAL
Free Testosterone: 68 pg/mL (ref 35.0–155.0)
Testosterone, Total, LC-MS-MS: 340 ng/dL (ref 250–1100)

## 2017-06-03 LAB — COMPREHENSIVE METABOLIC PANEL
AG Ratio: 2 (calc) (ref 1.0–2.5)
ALT: 20 U/L (ref 9–46)
AST: 22 U/L (ref 10–40)
BUN: 16 mg/dL (ref 7–25)
CO2: 28 mmol/L (ref 20–32)
Creat: 1.02 mg/dL (ref 0.60–1.35)
Glucose, Bld: 101 mg/dL — ABNORMAL HIGH (ref 65–99)
Sodium: 138 mmol/L (ref 135–146)
Total Protein: 6.8 g/dL (ref 6.1–8.1)

## 2017-06-03 LAB — COMPREHENSIVE METABOLIC PANEL WITH GFR
Albumin: 4.5 g/dL (ref 3.6–5.1)
Alkaline phosphatase (APISO): 54 U/L (ref 40–115)
Calcium: 9.1 mg/dL (ref 8.6–10.3)
Chloride: 103 mmol/L (ref 98–110)
Globulin: 2.3 g/dL (ref 1.9–3.7)
Potassium: 4.1 mmol/L (ref 3.5–5.3)
Total Bilirubin: 0.4 mg/dL (ref 0.2–1.2)

## 2017-06-03 LAB — CBC
HCT: 45 % (ref 38.5–50.0)
Hemoglobin: 15.4 g/dL (ref 13.2–17.1)
MCH: 28.4 pg (ref 27.0–33.0)
MCHC: 34.2 g/dL (ref 32.0–36.0)
MCV: 83 fL (ref 80.0–100.0)
MPV: 10.3 fL (ref 7.5–12.5)
Platelets: 239 10*3/uL (ref 140–400)
RBC: 5.42 10*6/uL (ref 4.20–5.80)
RDW: 12.6 % (ref 11.0–15.0)
WBC: 5.3 10*3/uL (ref 3.8–10.8)

## 2017-06-03 LAB — LUTEINIZING HORMONE: LH: 2.2 m[IU]/mL (ref 1.5–9.3)

## 2017-06-03 LAB — HIV ANTIBODY (ROUTINE TESTING W REFLEX): HIV 1&2 Ab, 4th Generation: NONREACTIVE

## 2017-06-03 LAB — D-DIMER, QUANTITATIVE: D-Dimer, Quant: <0.19 ug{FEU}/mL (ref <0.50)

## 2017-06-03 LAB — LIPID PANEL W/REFLEX DIRECT LDL
Cholesterol: 146 mg/dL (ref <200)
HDL: 67 mg/dL (ref >40)
LDL Cholesterol (Calc): 66 mg/dL (calc)
Non-HDL Cholesterol (Calc): 79 mg/dL (calc) (ref <130)
Total CHOL/HDL Ratio: 2.2 (calc) (ref <5.0)
Triglycerides: 58 mg/dL (ref <150)

## 2017-06-03 LAB — FOLLICLE STIMULATING HORMONE: FSH: 2 m[IU]/mL (ref 1.6–8.0)

## 2017-06-03 LAB — BRAIN NATRIURETIC PEPTIDE: Brain Natriuretic Peptide: 14 pg/mL (ref <100)

## 2017-06-03 LAB — TSH: TSH: 1.77 mIU/L (ref 0.40–4.50)

## 2017-06-03 LAB — VITAMIN D 25 HYDROXY (VIT D DEFICIENCY, FRACTURES): Vit D, 25-Hydroxy: 50 ng/mL (ref 30–100)

## 2017-06-30 ENCOUNTER — Ambulatory Visit: Payer: BLUE CROSS/BLUE SHIELD | Admitting: Sports Medicine
